# Patient Record
Sex: Male | Born: 2005 | Race: Black or African American | Hispanic: No | Marital: Single
Health system: Midwestern US, Community
[De-identification: ages and names within clinical notes are randomized; demographics above are authoritative.]

---
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
## 2023-03-10 ENCOUNTER — Encounter: Attending: Family Medicine | Primary: Family Medicine

## 2023-03-17 ENCOUNTER — Other Ambulatory Visit: Admit: 2023-03-17 | Discharge: 2023-03-17 | Payer: PRIVATE HEALTH INSURANCE | Primary: Family Medicine

## 2023-03-17 ENCOUNTER — Ambulatory Visit
Admit: 2023-03-17 | Discharge: 2023-03-17 | Payer: PRIVATE HEALTH INSURANCE | Attending: Family Medicine | Primary: Family Medicine

## 2023-03-17 DIAGNOSIS — Z1159 Encounter for screening for other viral diseases: Secondary | ICD-10-CM

## 2023-03-17 DIAGNOSIS — Z114 Encounter for screening for human immunodeficiency virus [HIV]: Secondary | ICD-10-CM

## 2023-03-17 NOTE — Progress Notes (Signed)
[facility]  [address] Sean Stark  [place], [place] [address]  Phone: [phone]  Fax: [phone]        Chief Complaint   Patient presents with    New Patient    Establish Care    Annual Exam     Sports physical for [employer]      Sean Stark is a 17 y.o. male who presents for establish care. Needs physical. New to [facility].    Sean Stark has no health problems. Takes no medications.  Reports to feel well today.    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    Followed with a pediatrician in [place].  Sean Stark is not sure of the name of the practice.  Vaccines were found in VIS and abstracted today.    Prior to Visit Medications    Medication Sig Taking? Authorizing Provider   cetirizine (ZYRTEC) 10 MG tablet Take 1 tablet by mouth as needed for Allergies Yes [provider]       No Known Allergies      Reviewed PmHx, RxHx, FmHx, SocHx, AllgHx and updated and dated in the chart.      Objective:     Vitals:    03/17/23 1015   BP: 110/58   Site: Right Upper Arm   Position: Sitting   Cuff Size: Medium Adult   Pulse: 64   Resp: 16   Temp: 98.1 F (36.7 C)   TempSrc: Temporal   SpO2: 97%   Weight: 73 kg (161 lb)   Height: 1.651 m (5\' 5" )   HC: 16 cm (6.3")     Physical Examination:    Physical Exam  Vitals and nursing note reviewed.   Constitutional:       General: Sean Stark is not in acute distress.     Appearance: Normal appearance.   HENT:      Head: Normocephalic and atraumatic.      Right Ear: Tympanic membrane, ear canal and external ear normal.      Left Ear: Tympanic membrane, ear canal and external ear normal.      Nose: Nose normal.      Mouth/Throat:      Mouth: Mucous membranes are moist.      Pharynx: No oropharyngeal exudate.   Eyes:      Extraocular Movements: Extraocular movements intact.      Pupils: Pupils are equal, round, and reactive to light.   Cardiovascular:      Rate and Rhythm: Normal rate and regular rhythm.      Pulses: Normal pulses.      Heart sounds:  Normal heart sounds.   Pulmonary:      Effort: Pulmonary effort is normal.      Breath sounds: Normal breath sounds.   Abdominal:      General: Abdomen is flat. Bowel sounds are normal. There is no distension.      Palpations: Abdomen is soft.      Tenderness: There is no abdominal tenderness.   Musculoskeletal:         General: Normal range of motion.      Cervical back: Normal range of motion.   Lymphadenopathy:      Cervical: No cervical adenopathy.   Skin:     General: Skin is warm and dry.      Findings: No rash.   Neurological:      General: No focal deficit present.  Mental Status: Sean Stark is alert and oriented to person, place, and time.      Cranial Nerves: No cranial nerve deficit.   Psychiatric:         Mood and Affect: Mood normal.         Behavior: Behavior normal.         No results found for this visit on 03/17/23.    Assessment/ Plan:   Sean Stark was seen today for new patient, establish care and annual exam.    Diagnoses and all orders for this visit:    Screening for HIV (human immunodeficiency virus)  -     HIV 1/2 Ag/Ab, 4TH Generation,W Rflx Confirm; Future    Encounter for HCV screening test for low risk patient  -     Hepatitis C Antibody; Future    Visit for well man health check  -     Lipid Panel; Future  -     Comprehensive Metabolic Panel; Future         All of the above diagnoses have the status of "stable" unless described below.  The plan for all above diagnoses is for the patient to return as documented below for continued follow up.  More specific details regarding the plan for certain diagnoses immediately follows.      Return in about 1 year (around 03/16/2024) for WME + fasting.     I have discussed the diagnosis with the patient and the intended treatment plan as seen in the above orders. The patient has received an after-visit summary and questions were answered concerning future plans. Asked to return should symptoms worsen or not improve with treatment. Any pending labs and studies  will be relayed to patient when they become available.     Pt verbalizes understanding of plan of care and denies further questions or concerns at this time.     Duanne Moron, MD, FAAFP

## 2023-03-17 NOTE — Progress Notes (Signed)
Identified pt with two pt identifiers(name and DOB).    Chief Complaint   Patient presents with    New Patient    Establish Care    Annual Exam     Sports physical for [employer]         Health Maintenance Due   Topic    Hepatitis B vaccine (1 of 3 - 3-dose series)    COVID-19 Vaccine (1)    Hepatitis A vaccine (1 of 2 - 2-dose series)    Measles,Mumps,Rubella (MMR) vaccine (1 of 2 - Standard series)    Varicella vaccine (1 of 2 - 2-dose childhood series)    DTaP/Tdap/Td vaccine (1 - Tdap)    HPV vaccine (1 - Male 2-dose series)    Depression Screen     HIV screen     Meningococcal (ACWY) vaccine (1 - 2-dose series)    Hepatitis C screen        Wt Readings from Last 3 Encounters:   No data found for Wt     Temp Readings from Last 3 Encounters:   No data found for Temp     BP Readings from Last 3 Encounters:   No data found for BP     Pulse Readings from Last 3 Encounters:   No data found for Pulse           Depression Screening:  :         03/17/2023    10:14 AM   PHQ-9 Questionaire   Little interest or pleasure in doing things 0   Feeling down, depressed, or hopeless 0   PHQ-9 Total Score 0        Fall Risk Assessment:  :          No data to display                 Abuse Screening:  :          No data to display                 Coordination of Care Questionnaire:  :     "Have you been to the ER, urgent care clinic since your last visit?  Hospitalized since your last visit?"    NO    "Have you seen or consulted any other health care providers outside of [facility] since your last visit?"    NO            Click Here for Release of Records Request

## 2023-03-21 LAB — COMPREHENSIVE METABOLIC PANEL
ALT: 29 U/L (ref 12–78)
AST: 10 U/L — ABNORMAL LOW (ref 15–37)
Albumin/Globulin Ratio: 1.5 (ref 1.1–2.2)
Albumin: 4.3 g/dL (ref 3.5–5.0)
Alk Phosphatase: 108 U/L (ref 60–330)
Anion Gap: 5 mmol/L (ref 5–15)
BUN/Creatinine Ratio: 14 (ref 12–20)
BUN: 14 MG/DL (ref 6–20)
CO2: 28 mmol/L (ref 21–32)
Calcium: 9.7 MG/DL (ref 8.5–10.1)
Chloride: 106 mmol/L (ref 97–108)
Creatinine: 0.99 MG/DL (ref 0.70–1.30)
Est, Glom Filt Rate: >90 mL/min/{1.73_m2} (ref >60)
Globulin: 2.9 g/dL (ref 2.0–4.0)
Glucose: 94 mg/dL (ref 65–100)
Potassium: 4.3 mmol/L (ref 3.5–5.1)
Sodium: 139 mmol/L (ref 136–145)
Total Bilirubin: 0.7 MG/DL (ref 0.2–1.0)
Total Protein: 7.2 g/dL (ref 6.4–8.2)

## 2023-03-21 LAB — LIPID PANEL
Chol/HDL Ratio: 2.1 (ref 0.0–5.0)
Cholesterol, Total: 142 MG/DL (ref <200)
HDL: 67 MG/DL — ABNORMAL HIGH (ref 34–59)
LDL Cholesterol: 60.2 MG/DL (ref 0–100)
Triglycerides: 74 MG/DL (ref <150)
VLDL Cholesterol Calculated: 14.8 MG/DL

## 2023-03-21 LAB — HIV 1/2 AG/AB, 4TH GENERATION,W RFLX CONFIRM: HIV 1/2 Interp: NONREACTIVE

## 2023-03-21 LAB — HEPATITIS C ANTIBODY
Hep C Ab Interp: NONREACTIVE
Hepatitis C Ab: 0.11 Index

## 2023-03-22 NOTE — Other (Signed)
Called patient and relayed results, patient understood.
